# Patient Record
Sex: Female | Born: 1960 | Race: White | Hispanic: No | Marital: Married | Smoking: Never smoker
Health system: Southern US, Community
[De-identification: ages and names within clinical notes are randomized; demographics above are authoritative.]

---

## 2012-02-02 DIAGNOSIS — C4491 Basal cell carcinoma of skin, unspecified: Secondary | ICD-10-CM | POA: Insufficient documentation

## 2013-01-28 DIAGNOSIS — E785 Hyperlipidemia, unspecified: Secondary | ICD-10-CM | POA: Insufficient documentation

## 2016-10-18 ENCOUNTER — Other Ambulatory Visit (HOSPITAL_COMMUNITY)
Admission: RE | Admit: 2016-10-18 | Discharge: 2016-10-18 | Disposition: A | Discharge status: Home / Self Care | Payer: BC Managed Care – PPO | Source: Ambulatory Visit | Attending: Obstetrics and Gynecology | Admitting: Obstetrics and Gynecology

## 2016-10-18 ENCOUNTER — Other Ambulatory Visit: Payer: Self-pay | Admitting: Obstetrics and Gynecology

## 2016-10-18 DIAGNOSIS — Z01419 Encounter for gynecological examination (general) (routine) without abnormal findings: Secondary | ICD-10-CM | POA: Insufficient documentation

## 2016-10-18 DIAGNOSIS — Z1151 Encounter for screening for human papillomavirus (HPV): Secondary | ICD-10-CM | POA: Insufficient documentation

## 2016-10-20 LAB — CYTOLOGY - PAP
Diagnosis: NEGATIVE
HPV: NOT DETECTED

## 2016-11-09 ENCOUNTER — Other Ambulatory Visit: Payer: Self-pay | Admitting: Obstetrics and Gynecology

## 2016-11-09 DIAGNOSIS — Z1231 Encounter for screening mammogram for malignant neoplasm of breast: Secondary | ICD-10-CM

## 2016-12-06 ENCOUNTER — Ambulatory Visit
Admission: RE | Admit: 2016-12-06 | Discharge: 2016-12-06 | Disposition: A | Discharge status: Home / Self Care | Payer: BC Managed Care – PPO | Source: Ambulatory Visit | Attending: Obstetrics and Gynecology | Admitting: Obstetrics and Gynecology

## 2016-12-06 DIAGNOSIS — Z1231 Encounter for screening mammogram for malignant neoplasm of breast: Secondary | ICD-10-CM

## 2018-01-03 ENCOUNTER — Other Ambulatory Visit: Payer: Self-pay | Admitting: Family Medicine

## 2018-01-03 DIAGNOSIS — Z1231 Encounter for screening mammogram for malignant neoplasm of breast: Secondary | ICD-10-CM

## 2018-01-25 ENCOUNTER — Encounter: Payer: Self-pay | Admitting: Radiology

## 2018-01-25 ENCOUNTER — Ambulatory Visit
Admission: RE | Admit: 2018-01-25 | Discharge: 2018-01-25 | Disposition: A | Discharge status: Home / Self Care | Payer: BC Managed Care – PPO | Source: Ambulatory Visit | Attending: Family Medicine | Admitting: Family Medicine

## 2018-01-25 DIAGNOSIS — Z1231 Encounter for screening mammogram for malignant neoplasm of breast: Secondary | ICD-10-CM

## 2019-02-05 ENCOUNTER — Other Ambulatory Visit: Payer: Self-pay | Admitting: Family Medicine

## 2019-02-05 DIAGNOSIS — Z1231 Encounter for screening mammogram for malignant neoplasm of breast: Secondary | ICD-10-CM

## 2019-02-07 ENCOUNTER — Other Ambulatory Visit: Payer: Self-pay

## 2019-02-07 ENCOUNTER — Ambulatory Visit
Admission: RE | Admit: 2019-02-07 | Discharge: 2019-02-07 | Disposition: A | Discharge status: Home / Self Care | Payer: BC Managed Care – PPO | Source: Ambulatory Visit | Attending: Family Medicine | Admitting: Family Medicine

## 2019-02-07 DIAGNOSIS — Z1231 Encounter for screening mammogram for malignant neoplasm of breast: Secondary | ICD-10-CM

## 2019-07-29 ENCOUNTER — Other Ambulatory Visit: Payer: Self-pay | Admitting: Family Medicine

## 2019-07-29 DIAGNOSIS — R1013 Epigastric pain: Secondary | ICD-10-CM

## 2019-08-01 ENCOUNTER — Ambulatory Visit
Admission: RE | Admit: 2019-08-01 | Discharge: 2019-08-01 | Disposition: A | Discharge status: Home / Self Care | Payer: BC Managed Care – PPO | Source: Ambulatory Visit | Attending: Family Medicine | Admitting: Family Medicine

## 2019-08-01 DIAGNOSIS — R1013 Epigastric pain: Secondary | ICD-10-CM

## 2019-12-29 ENCOUNTER — Other Ambulatory Visit: Payer: Self-pay | Admitting: Family Medicine

## 2019-12-29 DIAGNOSIS — Z1231 Encounter for screening mammogram for malignant neoplasm of breast: Secondary | ICD-10-CM

## 2020-02-09 ENCOUNTER — Other Ambulatory Visit: Payer: Self-pay

## 2020-02-09 ENCOUNTER — Ambulatory Visit
Admission: RE | Admit: 2020-02-09 | Discharge: 2020-02-09 | Disposition: A | Discharge status: Home / Self Care | Payer: BC Managed Care – PPO | Source: Ambulatory Visit | Attending: Family Medicine | Admitting: Family Medicine

## 2020-02-09 DIAGNOSIS — Z1231 Encounter for screening mammogram for malignant neoplasm of breast: Secondary | ICD-10-CM

## 2020-02-10 ENCOUNTER — Other Ambulatory Visit: Payer: Self-pay | Admitting: Family Medicine

## 2020-02-10 DIAGNOSIS — R928 Other abnormal and inconclusive findings on diagnostic imaging of breast: Secondary | ICD-10-CM

## 2020-02-16 ENCOUNTER — Ambulatory Visit: Payer: BC Managed Care – PPO

## 2020-02-16 ENCOUNTER — Other Ambulatory Visit: Payer: Self-pay

## 2020-02-16 ENCOUNTER — Ambulatory Visit
Admission: RE | Admit: 2020-02-16 | Discharge: 2020-02-16 | Disposition: A | Discharge status: Home / Self Care | Payer: BC Managed Care – PPO | Source: Ambulatory Visit | Attending: Family Medicine | Admitting: Family Medicine

## 2020-02-16 DIAGNOSIS — R928 Other abnormal and inconclusive findings on diagnostic imaging of breast: Secondary | ICD-10-CM

## 2020-09-09 DIAGNOSIS — D225 Melanocytic nevi of trunk: Secondary | ICD-10-CM | POA: Diagnosis not present

## 2020-09-09 DIAGNOSIS — L821 Other seborrheic keratosis: Secondary | ICD-10-CM | POA: Diagnosis not present

## 2020-09-09 DIAGNOSIS — Z85828 Personal history of other malignant neoplasm of skin: Secondary | ICD-10-CM | POA: Diagnosis not present

## 2020-12-02 DIAGNOSIS — Z23 Encounter for immunization: Secondary | ICD-10-CM | POA: Diagnosis not present

## 2020-12-27 DIAGNOSIS — Z5181 Encounter for therapeutic drug level monitoring: Secondary | ICD-10-CM | POA: Diagnosis not present

## 2020-12-27 DIAGNOSIS — M810 Age-related osteoporosis without current pathological fracture: Secondary | ICD-10-CM | POA: Diagnosis not present

## 2020-12-27 LAB — LAB REPORT - SCANNED: EGFR: 102

## 2020-12-30 DIAGNOSIS — Z0000 Encounter for general adult medical examination without abnormal findings: Secondary | ICD-10-CM | POA: Diagnosis not present

## 2021-01-13 ENCOUNTER — Other Ambulatory Visit: Payer: Self-pay | Admitting: Family Medicine

## 2021-01-13 DIAGNOSIS — Z1231 Encounter for screening mammogram for malignant neoplasm of breast: Secondary | ICD-10-CM

## 2021-03-04 ENCOUNTER — Other Ambulatory Visit: Payer: Self-pay

## 2021-03-04 ENCOUNTER — Ambulatory Visit
Admission: RE | Admit: 2021-03-04 | Discharge: 2021-03-04 | Disposition: A | Discharge status: Home / Self Care | Payer: BC Managed Care – PPO | Source: Ambulatory Visit | Attending: Family Medicine | Admitting: Family Medicine

## 2021-03-04 DIAGNOSIS — Z1231 Encounter for screening mammogram for malignant neoplasm of breast: Secondary | ICD-10-CM

## 2021-03-24 DIAGNOSIS — D225 Melanocytic nevi of trunk: Secondary | ICD-10-CM | POA: Diagnosis not present

## 2021-03-24 DIAGNOSIS — L814 Other melanin hyperpigmentation: Secondary | ICD-10-CM | POA: Diagnosis not present

## 2021-03-24 DIAGNOSIS — L570 Actinic keratosis: Secondary | ICD-10-CM | POA: Diagnosis not present

## 2021-03-24 DIAGNOSIS — L821 Other seborrheic keratosis: Secondary | ICD-10-CM | POA: Diagnosis not present

## 2021-03-24 DIAGNOSIS — L578 Other skin changes due to chronic exposure to nonionizing radiation: Secondary | ICD-10-CM | POA: Diagnosis not present

## 2022-01-15 ENCOUNTER — Emergency Department (HOSPITAL_COMMUNITY): Payer: BC Managed Care – PPO

## 2022-01-15 ENCOUNTER — Emergency Department (HOSPITAL_COMMUNITY)
Admission: EM | Admit: 2022-01-15 | Discharge: 2022-01-15 | Disposition: A | Discharge status: Home / Self Care | Payer: BC Managed Care – PPO | Attending: Emergency Medicine | Admitting: Emergency Medicine

## 2022-01-15 ENCOUNTER — Other Ambulatory Visit: Payer: Self-pay

## 2022-01-15 ENCOUNTER — Encounter (HOSPITAL_COMMUNITY): Payer: Self-pay

## 2022-01-15 DIAGNOSIS — M5416 Radiculopathy, lumbar region: Secondary | ICD-10-CM | POA: Insufficient documentation

## 2022-01-15 DIAGNOSIS — M545 Low back pain, unspecified: Secondary | ICD-10-CM | POA: Insufficient documentation

## 2022-01-15 DIAGNOSIS — W11XXXA Fall on and from ladder, initial encounter: Secondary | ICD-10-CM | POA: Insufficient documentation

## 2022-01-15 DIAGNOSIS — M25551 Pain in right hip: Secondary | ICD-10-CM | POA: Insufficient documentation

## 2022-01-15 DIAGNOSIS — S32010A Wedge compression fracture of first lumbar vertebra, initial encounter for closed fracture: Secondary | ICD-10-CM | POA: Diagnosis not present

## 2022-01-15 DIAGNOSIS — S32010S Wedge compression fracture of first lumbar vertebra, sequela: Secondary | ICD-10-CM | POA: Diagnosis not present

## 2022-01-15 DIAGNOSIS — W11XXXS Fall on and from ladder, sequela: Secondary | ICD-10-CM | POA: Insufficient documentation

## 2022-01-15 DIAGNOSIS — S32009S Unspecified fracture of unspecified lumbar vertebra, sequela: Secondary | ICD-10-CM | POA: Diagnosis present

## 2022-01-15 MED ORDER — ACETAMINOPHEN 325 MG PO TABS
650.0000 mg | ORAL_TABLET | Freq: Four times a day (QID) | ORAL | Status: DC | PRN
  Administered 2022-01-15: 650 mg via ORAL
  Filled 2022-01-15: qty 2

## 2022-01-15 MED ORDER — IBUPROFEN 800 MG PO TABS
800.0000 mg | ORAL_TABLET | Freq: Once | ORAL | Status: AC
  Administered 2022-01-15: 800 mg via ORAL
  Filled 2022-01-15: qty 1

## 2022-01-15 MED ORDER — LIDOCAINE 5 % EX PTCH
1.0000 | MEDICATED_PATCH | CUTANEOUS | Status: DC
  Administered 2022-01-15: 1 via TRANSDERMAL
  Filled 2022-01-15: qty 1

## 2022-01-15 MED ORDER — ACETAMINOPHEN 500 MG PO TABS
1000.0000 mg | ORAL_TABLET | Freq: Once | ORAL | Status: AC
  Administered 2022-01-15: 1000 mg via ORAL
  Filled 2022-01-15: qty 2

## 2022-01-15 NOTE — ED Notes (Signed)
Advised patient can not eat or drink anything at this time until we get scans and determine nothing surgical is going on. Husband verbalized understanding

## 2022-01-15 NOTE — ED Triage Notes (Signed)
Patient reports that she fell from a 6 ft ladder and landed on her tailbone. Patient c/o tingling from hips to feet when she is sitting. Patient denies hitting her head or having LOC.

## 2022-01-15 NOTE — ED Notes (Signed)
Called [facility] ortho tech who reports that brace guy had to go to [facility] first and then he would come here so probably another hour.

## 2022-01-15 NOTE — ED Provider Notes (Signed)
[facility] DEPT Provider Note   CSN: [id number] Arrival date & time: 01/15/22  1335     History  Chief Complaint  Patient presents with   Lytle Michaels    Leslie Randolph is a 61 y.o. female.  61 year old female previously healthy presents the emergency department with back pain radiating down her left leg and right hip pain after fall from a ladder.  Patient states that she was working outside on a ladder.  She is on the second top rung and her feet were approximately 5 feet off the ground.  Says that she fell landing directly on her bottom.  Says that she did not have a head strike or LOC.  Denies any other pain at this time including her arms.  Says that she was able to walk afterwards but is having a pain that shoots down her left leg.  Also states that she is having lower back pain and right hip pain.  Pt was able to ambulate into the ED afterwards.    Fall   History reviewed. No pertinent past medical history.     Home Medications Prior to Admission medications   Medication Sig Start Date End Date Taking? Authorizing Provider  Ascorbic Acid (VITAMIN C WITH ROSE HIPS) 1000 MG tablet Take 1,000 mg by mouth daily with breakfast.   Yes [provider]  Biotin 5000 MCG TABS Take 5,000 mcg by mouth daily with breakfast.   Yes [provider]  Cholecalciferol (D3) 50 MCG (2000 UT) TABS Take 2,000 Units by mouth daily with breakfast.   Yes [provider]  Coenzyme Q10 (COQ10) 100 MG CAPS Take 100 mg by mouth daily with breakfast.   Yes [provider]  Cranberry 500 MG TABS Take 500 mg by mouth daily with breakfast.   Yes [provider]  Folic Acid (FOLATE PO) Take 1,333 mcg by mouth daily with breakfast.   Yes [provider]  Lactobacillus (ACIDOPHILUS) CAPS capsule Take 1 capsule by mouth at bedtime.   Yes [provider]  levOCARNitine (L-CARNITINE PO) Take 1 capsule by mouth daily with  breakfast.   Yes [provider]  MAGNESIUM CITRATE PO Take 210 mg by mouth daily with breakfast.   Yes [provider]  Methylcobalamin 1000 MCG TBDP Take 1,000 mcg by mouth daily with breakfast. B12   Yes [provider]  Multiple Vitamin (MULTIVITAMIN WITH MINERALS) TABS tablet Take 1 tablet by mouth daily with breakfast.   Yes [provider]  Omega-3 Fatty Acids (FISH OIL TRIPLE STRENGTH) 1400 MG CAPS Take 2,800 mg by mouth daily with breakfast.   Yes [provider]  OVER THE COUNTER MEDICATION Take 1 tablet by mouth daily with breakfast. Chelated zinc 15 mg with copper 2 mg   Yes [provider]  OVER THE COUNTER MEDICATION Take 400 mg by mouth daily with breakfast. Gingko biloba   Yes [provider]  Quercetin 500 MG CAPS Take 500 mg by mouth daily with breakfast.   Yes [provider]  Turmeric 500 MG CAPS Take 500 mg by mouth daily with breakfast.   Yes [provider]  vitamin E 200 UNIT capsule Take 200 Units by mouth daily with breakfast.   Yes [provider]      Allergies    Amiodarone, Bisphosphonates, Ciprofloxacin, Clindamycin/lincomycin, Fentanyl, Hydrocodone, Hydrocodone-acetaminophen, Morphine, Other, Oxycontin [oxycodone], Quinolones, and Statins    Review of Systems   Review of Systems  Physical Exam  Updated Vital Signs BP 121/88   Pulse 77   Temp 98.1 F (36.7 C) (Oral)   Resp 16   Ht '5\' 4"'$  (1.626 m)   Wt 56.7 kg   SpO2 97%   BMI 21.46 kg/m  Physical Exam Vitals and nursing note reviewed.  Constitutional:      General: She is not in acute distress.    Appearance: She is well-developed.  HENT:     Head: Normocephalic and atraumatic.     Right Ear: External ear normal.     Left Ear: External ear normal.     Nose: Nose normal.  Eyes:     Extraocular Movements: Extraocular movements intact.     Conjunctiva/sclera: Conjunctivae normal.     Pupils: Pupils are equal,  round, and reactive to light.  Neck:     Comments: C-collar in place Cardiovascular:     Rate and Rhythm: Normal rate and regular rhythm.  Pulmonary:     Effort: Pulmonary effort is normal.  Abdominal:     General: Abdomen is flat. Bowel sounds are normal. There is no distension.     Palpations: Abdomen is soft. There is no mass.     Tenderness: There is no abdominal tenderness. There is no guarding.  Musculoskeletal:        General: No swelling.  Skin:    General: Skin is warm and dry.     Capillary Refill: Capillary refill takes less than 2 seconds.  Neurological:     Mental Status: She is alert.     Comments: Spinal midline tenderness to palpation at approximately L3 to sacrum. No stepoffs noted.   Motor: Muscle bulk and tone are normal. Strength is 5/5 in hip flexion, knee flexion and extension, ankle dorsiflexion and plantar flexion bilaterally. Full strength of great toe dorsiflexion bilaterally.  Sensory: Intact sensation to light touch in L5 though S1 dermatomes bilaterally.   Intact pubic symphysis.  Psychiatric:        Mood and Affect: Mood normal.     ED Results / Procedures / Treatments   Labs (all labs ordered are listed, but only abnormal results are displayed) Labs Reviewed - No data to display  EKG None  Radiology CT Lumbar Spine Wo Contrast  Result Date: 01/15/2022 CLINICAL DATA:  Fall ladder. Right hip pain. EXAM: CT LUMBAR SPINE WITHOUT CONTRAST TECHNIQUE: Multidetector CT imaging of the lumbar spine was performed without intravenous contrast administration. Multiplanar CT image reconstructions were also generated. RADIATION DOSE REDUCTION: This exam was performed according to the departmental dose-optimization program which includes automated exposure control, adjustment of the mA and/or kV according to patient size and/or use of iterative reconstruction technique. COMPARISON:  None Available. FINDINGS: Segmentation: 5 non rib-bearing lumbar type vertebral  bodies are present. The lowest fully formed vertebral body is L5. Alignment: Slight degenerative retrolisthesis present at L2-3. No other significant listhesis is present. Lumbar lordosis is preserved. Vertebrae: A superior endplate fracture is present at L1 with over 50% loss of height relative to the T12 vertebral body. Slight retropulsed bone is noted on the right without significant central canal narrowing. Foramina are patent bilaterally. No additional fractures are evident. Paraspinal and other soft tissues: Stranding about the fracture likely represents minimal paraspinal hematoma. No intracanalicular hemorrhage present. Visualized abdomen is unremarkable. Disc levels: L1-2: No significant stenosis. L2-3: Mild facet hypertrophy is present. Mild disc bulging is present without significant stenosis. L3-4: Mild disc bulging and facet hypertrophy is present without significant stenosis. L4-5: Mild disc  bulging and facet hypertrophy is present without significant stenosis. L5-S1: Incomplete posterior spinous fusion is present. No significant protrusion present. Asymmetric left-sided facet hypertrophy present without significant stenosis. IMPRESSION: 1. Superior endplate fracture at L1 with over 50% loss of height relative to the T12 vertebral body. 2. Slight retropulsed bone on the right without significant central canal stenosis. 3. No additional fractures. 4. Mild multilevel spondylosis as described. These results were called by telephone at the time of interpretation on 01/15/2022 at 4:25 pm to provider [facility] , who verbally acknowledged these results. Electronically Signed   By: San Morelle M.D.   On: 01/15/2022 16:27   CT PELVIS WO CONTRAST  Result Date: 01/15/2022 CLINICAL DATA:  Fall 5 a ladder. Right hip pain. EXAM: CT PELVIS WITHOUT CONTRAST TECHNIQUE: Multidetector CT imaging of the pelvis was performed following the standard protocol without intravenous contrast. RADIATION DOSE  REDUCTION: This exam was performed according to the departmental dose-optimization program which includes automated exposure control, adjustment of the mA and/or kV according to patient size and/or use of iterative reconstruction technique. COMPARISON:  None Available. FINDINGS: Urinary Tract: Distal ureters and urinary bladder are within normal limits. Bowel:  Visualized large and small bowel is within normal limits. Vascular/Lymphatic: Atherosclerotic calcifications are present the distal aorta. No aneurysm. No significant adenopathy is present. Reproductive:  No mass or other significant abnormality Other:  Free fluid or free air is present. Musculoskeletal: The hips are located bilaterally. No acute fractures are present. Bony pelvis is within normal limits. IMPRESSION: No acute or healing fractures. Electronically Signed   By: San Morelle M.D.   On: 01/15/2022 16:18    Procedures Procedures   Medications Ordered in ED Medications  lidocaine (LIDODERM) 5 % 1 patch (1 patch Transdermal Patch Applied 01/15/22 1452)  acetaminophen (TYLENOL) tablet 650 mg (650 mg Oral Given 01/15/22 1942)  acetaminophen (TYLENOL) tablet 1,000 mg (1,000 mg Oral Given 01/15/22 1452)  ibuprofen (ADVIL) tablet 800 mg (800 mg Oral Given 01/15/22 1452)    ED Course/ Medical Decision Making/ A&P Clinical Course as of 01/15/22 2349  Sun Jan 15, 2022  1614 CT reviewed and interpreted by me and does show L1 vertebral body fracture.  Neurosurgery consulted. [RP]  3295 Called by radiology about lumbar spine fracture.  States that the pedicles are not involved and feels that the compression fracture is stable.  [RP]  1640 CT Lumbar Spine Wo Contrast IMPRESSION: 1. Superior endplate fracture at L1 with over 50% loss of height relative to the T12 vertebral body. 2. Slight retropulsed bone on the right without significant central canal stenosis. 3. No additional fractures. 4. Mild multilevel spondylosis as  described.   [RP]  1884 Spoke with Dr Marvis Moeller, DNP regarding patient.  He has reviewed imaging.  Recommends TSLO brace and follow-up in clinic in 4 weeks. [RP]    Clinical Course User Index [RP] Fransico Meadow, MD                           Medical Decision Making 61 year old female otherwise healthy who presented to the emergency department with back pain after falling off of a ladder.  Patient states that her pain is localized to the lumbar spine at this time.  Did not have head strike or LOC.  Intact neurologic exam though does states she has paresthesias down her left leg.  No cervical spine pain and her c-collar was cleared clinically.  CT of  the lumbar spine and pelvis showed compression fracture of L1.  Discussed with on-call neurosurgery PA felt that the patient could have a back brace and follow-up in clinic in 1 month.  Patient was given a brace and was discharged home with instructions for pain management.    Amount and/or Complexity of Data Reviewed Radiology: ordered. Decision-making details documented in ED Course.  Risk OTC drugs. Prescription drug management.    Final Clinical Impression(s) / ED Diagnoses Final diagnoses:  Compression fracture of L1 vertebra, sequela  Lumbar radiculopathy  Pain of right hip    Rx / DC Orders ED Discharge Orders     None         Fransico Meadow, MD 01/15/22 2349

## 2022-01-15 NOTE — ED Notes (Signed)
Patient ok to eat per Md

## 2022-01-15 NOTE — ED Provider Triage Note (Signed)
Emergency Medicine Provider Triage Evaluation Note  Leslie Randolph , a 61 y.o. female  was evaluated in triage.  Pt complains of diffuse back pain after a fall off a 6 foot latter prior to arrival. She denies hitting her head. She reports that she landed on her bottom. Deniies any blood thinner. Denies any chest pain or abdominal pain.  Review of Systems  Positive:  Negative:   Physical Exam  BP 137/69 (BP Location: Left Arm)   Pulse 78   Temp 98.1 F (36.7 C) (Oral)   Resp 18   Ht '5\' 4"'$  (1.626 m)   Wt 56.7 kg   SpO2 100%   BMI 21.46 kg/m  Gen:   Awake, no distress   Resp:  Normal effort  MSK:   Moves extremities without difficulty  Other:  No chest or abdominal pain.   Medical Decision Making  Medically screening exam initiated at 2:16 PM.  Appropriate orders placed.  Leslie Randolph was informed that the remainder of the evaluation will be completed by another provider, this initial triage assessment does not replace that evaluation, and the importance of remaining in the ED until their evaluation is complete.  C-collar and spinal precautions patietnt being roomed now.     Sherrell Puller, PA-C 01/15/22 8016

## 2022-01-15 NOTE — ED Notes (Signed)
Patient requesting pain meds for hips, MD secured chatted.  Waiting on orders.

## 2022-01-15 NOTE — Discharge Instructions (Addendum)
Today you were seen in the emergency department after the fall from the ladder    In the emergency department you had CT scans that showed a lumbar spine fracture.    At home, please take Tylenol and Motrin as needed for your pain.  You may also use over-the-counter lidocaine patches.  Use the back brace we have prescribed you until you follow-up with neurosurgery.  You may take it off to shower but wear it otherwise.   Follow-up with your primary doctor in 2-3 days regarding your visit.  Follow-up with neurosurgery in 1 month regarding your visit and discussed with them when you may stop using the brace.  Return immediately to the emergency department if you experience any of the following: Leg numbness or weakness, urinary or bowel incontinence, or difficulty urinating, or any other concerning symptoms.    Thank you for visiting our Emergency Department. It was a pleasure taking care of you today.

## 2022-01-15 NOTE — Progress Notes (Signed)
Orthopedic Tech Progress Note Patient Details:  Leslie Randolph Aug 27, 1960 [id number]  Patient ID: Leslie Randolph, female   DOB: Oct 19, 1960, 61 y.o.   MRN: [id number]  [place] for brace order. Cristobal Goldmann 01/15/2022, 4:23 PM

## 2022-01-15 NOTE — ED Notes (Signed)
Called [facility] ortho tech to arrange delivery of TLSO brace.

## 2022-01-18 ENCOUNTER — Ambulatory Visit (INDEPENDENT_AMBULATORY_CARE_PROVIDER_SITE_OTHER): Payer: BC Managed Care – PPO | Admitting: Internal Medicine

## 2022-01-18 ENCOUNTER — Encounter: Payer: Self-pay | Admitting: Internal Medicine

## 2022-01-18 VITALS — BP 100/68 | HR 93 | Temp 98.0°F | Ht 64.0 in | Wt 125.6 lb

## 2022-01-18 DIAGNOSIS — L814 Other melanin hyperpigmentation: Secondary | ICD-10-CM | POA: Insufficient documentation

## 2022-01-18 DIAGNOSIS — D225 Melanocytic nevi of trunk: Secondary | ICD-10-CM | POA: Insufficient documentation

## 2022-01-18 DIAGNOSIS — Z85828 Personal history of other malignant neoplasm of skin: Secondary | ICD-10-CM | POA: Insufficient documentation

## 2022-01-18 DIAGNOSIS — S32010A Wedge compression fracture of first lumbar vertebra, initial encounter for closed fracture: Secondary | ICD-10-CM | POA: Insufficient documentation

## 2022-01-18 DIAGNOSIS — S32000A Wedge compression fracture of unspecified lumbar vertebra, initial encounter for closed fracture: Secondary | ICD-10-CM

## 2022-01-18 DIAGNOSIS — L821 Other seborrheic keratosis: Secondary | ICD-10-CM | POA: Insufficient documentation

## 2022-01-18 DIAGNOSIS — S32010D Wedge compression fracture of first lumbar vertebra, subsequent encounter for fracture with routine healing: Secondary | ICD-10-CM

## 2022-01-18 HISTORY — DX: Wedge compression fracture of unspecified lumbar vertebra, initial encounter for closed fracture: S32.000A

## 2022-01-18 MED ORDER — CODEINE SULFATE 30 MG PO TABS: 30.0000 mg | ORAL_TABLET | Freq: Four times a day (QID) | ORAL | 0 refills | Status: AC | PRN

## 2022-01-18 NOTE — Assessment & Plan Note (Addendum)
This is an acute, new, and complex problem which required extensive patient education on safety and management while she awaits neurosurgery eval.  She is wearing tur? brace which we discussed how to wear properly  We discussed avoiding axial loading and spine safety She will keep planned neurosurgery referralI spoke with our physical therapists who said they could take in consult, and referred. I independently  Reviewed and interpreted  the CT images for the spine and pelvis from the ER, showing the images to the patient showing the complicated L1 fracture and the hips look fine so I attribute her hip pain to nerve compression from the fracture.  I agreed to write one week of codeine for pain, and follow up next week to see how its doing I strongly encouraged calcium and vit D supplement. She agreed to do.  She disagreed/defers on my recommendation to use bisphosphonates, which I made based on areas of poor bone mineralization that I saw on her CT scan.

## 2022-01-18 NOTE — Progress Notes (Signed)
      Leslie Randolph is a 61 y.o. female who presents today for an office visit. She would like to establish care but the visit was focused primarily on following up her ER visit and managing her acute l1 spinal fracture  Assessment/Plan:  Leslie Randolph was seen today for establish care and follow-up.  Compression fracture of L1 vertebra with routine healing, subsequent encounter Assessment & Plan: This is an acute, new, and complex problem which required extensive patient education on safety and management while she awaits neurosurgery eval.  She is wearing tur? brace which we discussed how to wear properly  We discussed avoiding axial loading and spine safety She will keep planned neurosurgery referralI spoke with our physical therapists who said they could take in consult, and referred. I independently  Reviewed and interpreted  the CT images for the spine and pelvis from the ER, showing the images to the patient showing the complicated L1 fracture and the hips look fine so I attribute her hip pain to nerve compression from the fracture.  I agreed to write one week of codeine for pain, and follow up next week to see how its doing I strongly encouraged calcium and vit D supplement. She agreed to do.  She disagreed/defers on my recommendation to use bisphosphonates, which I made based on areas of poor bone mineralization that I saw on her CT scan.  Orders: -     Codeine Sulfate; Take 1 tablet (30 mg total) by mouth every 6 (six) hours as needed for up to 8 days.  Dispense: 30 tablet; Refill: 0 -     Ambulatory referral to Physical Therapy     Subjective:  HPI:  This is a new patient visit for PCP establish was following with [facility] but her pcp there left.  Also a hospital follow up for L1 compression fracture. Also she wants to talk about physical therapy-referred  [address] off ladder landed in grass on buttocks and went to ER 3 days ago.  Has been using ibuprofen and brace for  pain but not adequate. Er offered oxycontin which she didn't want but declined to give her codeine which she knows she cant take.          Objective:  Physical Exam: BP 100/68   Pulse 93   Temp 98 F (36.7 C) (Temporal)   Ht '5\' 4"'$  (1.626 m)   Wt 125 lb 9.6 oz (57 kg)   SpO2 94%   BMI 21.56 kg/m    Gen: No acute distress, uncomfortable, never sits down, remains standing. Psych: Normal affect and thought content  Problem specific physical exam findings: wearing brace, able to stand and walk normally with brace.        Loralee Pacas, MD 01/18/2022 3:27 PM

## 2022-01-19 ENCOUNTER — Ambulatory Visit (INDEPENDENT_AMBULATORY_CARE_PROVIDER_SITE_OTHER): Payer: BC Managed Care – PPO | Admitting: Physical Therapy

## 2022-01-19 ENCOUNTER — Encounter: Payer: Self-pay | Admitting: Physical Therapy

## 2022-01-19 DIAGNOSIS — M25551 Pain in right hip: Secondary | ICD-10-CM | POA: Diagnosis not present

## 2022-01-19 DIAGNOSIS — M6281 Muscle weakness (generalized): Secondary | ICD-10-CM

## 2022-01-19 DIAGNOSIS — M25552 Pain in left hip: Secondary | ICD-10-CM

## 2022-01-19 DIAGNOSIS — M5459 Other low back pain: Secondary | ICD-10-CM

## 2022-01-19 NOTE — Therapy (Signed)
OUTPATIENT PHYSICAL THERAPY THORACOLUMBAR EVALUATION   Patient Name: GENEVA BARRERO MRN: [id number] DOB:07/02/1960, 61 y.o., female Today's Date: 01/19/2022   PT End of Session - 01/19/22 0806     Visit Number 1    Number of Visits 20    Date for PT Re-Evaluation 04/13/22    Authorization Type BCBS    PT Start Time 0807    PT Stop Time 0846    PT Time Calculation (min) 39 min    Activity Tolerance Patient tolerated treatment well    Behavior During Therapy [facility] for tasks assessed/performed             Past Medical History:  Diagnosis Date   Fracture of lumbar vertebra, compression ([place]) 01/18/2022   History reviewed. No pertinent surgical history. Patient Active Problem List   Diagnosis Date Noted   Seborrheic keratoses 01/18/2022   Melanocytic nevi of trunk 01/18/2022   Lentigines 01/18/2022   History of skin cancer 01/18/2022   Closed compression fracture of body of L1 vertebra ([place]) 01/18/2022   HLD (hyperlipidemia) 01/28/2013   Basal cell carcinoma of skin 02/02/2012    PCP: Berniece Pap, MD  REFERRING PROVIDER: Berniece Pap, MD  REFERRING DIAG: S32.010D (ICD-10-CM) - Compression fracture of L1 vertebra with routine healing, subsequent encounter  Rationale for Evaluation and Treatment Rehabilitation  THERAPY DIAG:  Other low back pain  Pain in left hip  Pain in right hip  Muscle weakness (generalized)  ONSET DATE: 01/15/22  SUBJECTIVE:                                                                                                                                                                                           SUBJECTIVE STATEMENT: Patient fell from 6 ft ladder and landed on tailbone on 02/15/22. Had tingling in hips and feet with sitting at first and now mainly pain in hips with R>L. States she was placed in a TLSO brace and sees neurosurgery on 01/30/22.  Pain is worse in the morning, has been wearing brace in bed and all day. States that  she is constantly adjusting the brace as it shifts.  PERTINENT HISTORY:  none  PAIN:  Are you having pain? Yes: NPRS scale: 4/10 Pain location: mid low back and into hip Pain description: aching Aggravating factors: sitting Relieving factors: laying down, movement   PRECAUTIONS: Other: spinal  WEIGHT BEARING RESTRICTIONS No  FALLS:  Has patient fallen in last 6 months? Yes. Number of falls 1 off ladder  LIVING ENVIRONMENT: Lives with: lives with their family Lives in: House/apartment Stairs: Yes: Internal: 12 steps; on right going up and External:  9 steps; on left going up Has following equipment at home: donner, and grabber   OCCUPATION: achieve librarian   PLOF: Independent, likes to hike, body pump classes, very active  PATIENT GOALS to get back to prior level function   OBJECTIVE:   Transitional movements: stiff log rolling supine to sit, stiff with B UE to transition from sit to stand DIAGNOSTIC FINDINGS:  01/15/22 Hip CT IMPRESSION: No acute or healing fractures.  Lumbar CT IMPRESSION: 1. Superior endplate fracture at L1 with over 50% loss of height relative to the T12 vertebral body. 2. Slight retropulsed bone on the right without significant central canal stenosis. 3. No additional fractures. 4. Mild multilevel spondylosis as described.   SCREENING FOR RED FLAGS: Bowel or bladder incontinence: No Spinal tumors: No Cauda equina syndrome: No Compression fracture: Yes: observed in flims Abdominal aneurysm: No  COGNITION:  Overall cognitive status: Within functional limits for tasks assessed     SENSATION: WFL    POSTURE: in brace   PALPATION: Tenderness to palpation in lumbar/glute muscles  LUMBAR ROM: not tested secondary to recent fracture- awaiting neuro consult  Active  A/PROM    Flexion   Extension   Right lateral flexion   Left lateral flexion   Right rotation   Left rotation    (Blank rows = not tested)     LE  Measurements not tested on this date Lower Extremity Right 01/19/2022 Left 01/19/2022   A/PROM MMT A/PROM MMT  Hip Flexion      Hip Extension      Hip Abduction      Hip Adduction      Hip Internal rotation      Hip External rotation      Knee Flexion      Knee Extension      Ankle Dorsiflexion      Ankle Plantarflexion      Ankle Inversion      Ankle Eversion       (Blank rows = not tested)  * pain    TODAY'S TREATMENT  01/19/2022  Therapeutic Exercise:  Aerobic: Supine: bent knee fall outs 2 minutes, bent knee fall ins (no lumbar ROT) 2 minutes, hamstring stretch 2 minutes Each leg. Towel to support, diaphragmatic breathing - difficult 2 minutes Prone:  Seated:  Standing: Neuromuscular Re-education: Manual Therapy: Therapeutic Activity: Self Care: Trigger Point Dry Needling:  Modalities:    PATIENT EDUCATION:  Education details: on current presentation, on HEP, on clinical outcomes score and POC, on spinal restrictions, on no axial loading, on walking/ safe movements Person educated: Patient Education method: Explanation, Demonstration, and Handouts Education comprehension: verbalized understanding    HOME EXERCISE PROGRAM: [id number]  ASSESSMENT:  CLINICAL IMPRESSION: Patient presents with acute lumbar fracture after a fall on a ladder on 01/15/22. She was seen in the ED where they gave her a TSLO brace and scheduled her an apt to follow up with neurosurgeon. Session focused on education secondary to wanting patient to follow up with MD prior to adding in more advanced exercises. Patient would greatly benefit form skilled PT to improve overall function and return them to quality of life.    OBJECTIVE IMPAIRMENTS decreased activity tolerance, decreased endurance, decreased mobility, decreased ROM, improper body mechanics, postural dysfunction, and pain.   ACTIVITY LIMITATIONS carrying, lifting, bending, sitting, standing, squatting, sleeping, transfers, and bed  mobility  PARTICIPATION LIMITATIONS: meal prep, cleaning, driving, community activity, and yard work  [place] are also affecting patient's functional outcome.  REHAB POTENTIAL: Excellent  CLINICAL DECISION MAKING: Stable/uncomplicated  EVALUATION COMPLEXITY: Low    GOALS: Goals reviewed with patient?  yes  SHORT TERM GOALS:  Patient will be independent in self management strategies to improve quality of life and functional outcomes. Baseline: new program Target date: 03/02/2022 Goal status: INITIAL  2.  Patient will report at least 50% improvement in overall symptoms and/or function to demonstrate improved functional mobility Baseline: 0% Target date: 03/02/2022 Goal status: INITIAL  3.  Patient will be able to get in and out of the bed without pain with transition to standing to improve transitional mobility Baseline: painful Target date: 03/02/2022 Goal status: INITIAL  4.  Patient will be able to ascend and descend stairs with reciprocal gait pattern and no railing to improve ability to go up and down stairs at home  Baseline: one at a time with railing Target date: 03/02/2022 Goal status: INITIAL     LONG TERM GOALS:  Patient will report at least 75% improvement in overall symptoms and/or function to demonstrate improved functional mobility Baseline: 0% Target date: 04/13/2022 Goal status: INITIAL  2.  Patient will be able to demonstrate good lifting mechanics out of brace, pending MD approval to return to PLOF Baseline: unable Target date: 04/13/2022 Goal status: INITIAL  3.  Patient will be able to hike up to 5 miles without pain or difficulties, per MD approval to return to PLOF. Baseline: unable Target date: 04/13/2022 Goal status: INITIAL  4.  Patient will be able to sit for at least 60 minutes at a time without pain to improve sitting tolerance Baseline: unable Target date: 04/13/2022 Goal status: INITIAL      PLAN: PT  FREQUENCY: 1-2x/week for total of 20 visits over 12 week certification  PT DURATION: 12 weeks  PLANNED INTERVENTIONS: Therapeutic exercises, Therapeutic activity, Neuromuscular re-education, Balance training, Gait training, Patient/Family education, Self Care, Joint mobilization, Orthotic/Fit training, Aquatic Therapy, Dry Needling, Spinal manipulation, Spinal mobilization, Cryotherapy, Moist heat, Ionotophoresis '4mg'$ /ml Dexamethasone, and Manual therapy.  PLAN FOR NEXT SESSION: table exercises, f/u with breath work, isometrics   11:04 AM, 01/19/22 Jerene Pitch, DPT Physical Therapy with [place]

## 2022-01-25 ENCOUNTER — Ambulatory Visit (INDEPENDENT_AMBULATORY_CARE_PROVIDER_SITE_OTHER): Payer: BC Managed Care – PPO | Admitting: Physical Therapy

## 2022-01-25 ENCOUNTER — Ambulatory Visit (INDEPENDENT_AMBULATORY_CARE_PROVIDER_SITE_OTHER): Payer: BC Managed Care – PPO | Admitting: Internal Medicine

## 2022-01-25 ENCOUNTER — Encounter: Payer: Self-pay | Admitting: Internal Medicine

## 2022-01-25 ENCOUNTER — Encounter: Payer: Self-pay | Admitting: Physical Therapy

## 2022-01-25 VITALS — BP 102/70 | HR 76 | Temp 97.9°F | Ht 64.0 in | Wt 126.2 lb

## 2022-01-25 DIAGNOSIS — M5459 Other low back pain: Secondary | ICD-10-CM | POA: Diagnosis not present

## 2022-01-25 DIAGNOSIS — M25552 Pain in left hip: Secondary | ICD-10-CM

## 2022-01-25 DIAGNOSIS — M25551 Pain in right hip: Secondary | ICD-10-CM

## 2022-01-25 DIAGNOSIS — M6281 Muscle weakness (generalized): Secondary | ICD-10-CM

## 2022-01-25 DIAGNOSIS — S32010A Wedge compression fracture of first lumbar vertebra, initial encounter for closed fracture: Secondary | ICD-10-CM

## 2022-01-25 NOTE — Therapy (Signed)
OUTPATIENT PHYSICAL THERAPY TREATMENT NOTE   Patient Name: Leslie Randolph MRN: [id number] DOB:Jul 04, 1960, 61 y.o., female Today's Date: 01/25/2022    END OF SESSION:   PT End of Session - 01/25/22 1102     Visit Number 2    Number of Visits 20    Date for PT Re-Evaluation 04/13/22    Authorization Type BCBS    PT Start Time 1103    PT Stop Time 1141    PT Time Calculation (min) 38 min    Activity Tolerance Patient tolerated treatment well    Behavior During Therapy WFL for tasks assessed/performed             Past Medical History:  Diagnosis Date   Fracture of lumbar vertebra, compression ([place]) 01/18/2022   History reviewed. No pertinent surgical history. Patient Active Problem List   Diagnosis Date Noted   Seborrheic keratoses 01/18/2022   Melanocytic nevi of trunk 01/18/2022   Lentigines 01/18/2022   History of skin cancer 01/18/2022   Closed compression fracture of body of L1 vertebra ([place]) 01/18/2022   HLD (hyperlipidemia) 01/28/2013   Basal cell carcinoma of skin 02/02/2012   PCP: Berniece Pap, MD   REFERRING PROVIDER: Berniece Pap, MD   REFERRING DIAG: S32.010D (ICD-10-CM) - Compression fracture of L1 vertebra with routine healing, subsequent encounter   Rationale for Evaluation and Treatment Rehabilitation   THERAPY DIAG:  Other low back pain   Pain in left hip   Pain in right hip   Muscle weakness (generalized)   ONSET DATE: 01/15/22   SUBJECTIVE:                                                                                                                                                                                            SUBJECTIVE STATEMENT: Saw MD and released to go back to work. Overall feeling better. States she got 6 hours of sleep and is not wearing  brace at night.  Eval: Patient fell from 6 ft ladder and landed on tailbone on 02/15/22. Had tingling in hips and feet with sitting at first and now mainly pain in hips with R>L.  States she was placed in a TLSO brace and sees neurosurgery on 01/30/22.   Pain is worse in the morning, has been wearing brace in bed and all day. States that she is constantly adjusting the brace as it shifts.   PERTINENT HISTORY:  none   PAIN:  Are you having pain? Yes: NPRS scale: 3/10 Pain location: mid low back and into hip Pain description: aching Aggravating factors: sitting Relieving factors: laying down, movement  PRECAUTIONS: Other: spinal   WEIGHT BEARING RESTRICTIONS No   FALLS:  Has patient fallen in last 6 months? Yes. Number of falls 1 off ladder   LIVING ENVIRONMENT: Lives with: lives with their family Lives in: House/apartment Stairs: Yes: Internal: 12 steps; on right going up and External: 9 steps; on left going up Has following equipment at home: donner, and grabber    OCCUPATION: achieve librarian    PLOF: Independent, likes to hike, body pump classes, very active   PATIENT GOALS to get back to prior level function     OBJECTIVE:             Transitional movements: stiff log rolling supine to sit, stiff with B UE to transition from sit to stand DIAGNOSTIC FINDINGS:  01/15/22 Hip CT IMPRESSION: No acute or healing fractures.   Lumbar CT IMPRESSION: 1. Superior endplate fracture at L1 with over 50% loss of height relative to the T12 vertebral body. 2. Slight retropulsed bone on the right without significant central canal stenosis. 3. No additional fractures. 4. Mild multilevel spondylosis as described.     SCREENING FOR RED FLAGS: Bowel or bladder incontinence: No Spinal tumors: No Cauda equina syndrome: No Compression fracture: Yes: observed in flims Abdominal aneurysm: No   COGNITION:           Overall cognitive status: Within functional limits for tasks assessed                          SENSATION: WFL       POSTURE: in brace    PALPATION: Tenderness to palpation in lumbar/glute muscles   LUMBAR ROM: not tested secondary  to recent fracture- awaiting neuro consult   Active  A/PROM     Flexion    Extension    Right lateral flexion    Left lateral flexion    Right rotation    Left rotation     (Blank rows = not tested)                  LE Measurements not tested on this date       Lower Extremity Right 01/19/2022 Left 01/19/2022    A/PROM MMT A/PROM MMT  Hip Flexion          Hip Extension          Hip Abduction          Hip Adduction          Hip Internal rotation          Hip External rotation          Knee Flexion          Knee Extension          Ankle Dorsiflexion          Ankle Plantarflexion          Ankle Inversion          Ankle Eversion           (Blank rows = not tested)            * pain       TODAY'S TREATMENT  01/25/2022 Therapeutic Exercise:    Aerobic: Supine: hip add iso ball 3 minutes, hip abd band  YTB 3 minutes Prone:    Seated:    Standing: heel raises x20 5" holds, SLS x3 30" holds B Neuromuscular Re-education: diaphragmatic breathing 5 minutes - tactile cues, TRA activation  with long exhale 4 minutes tactile cues Manual Therapy: Therapeutic Activity: Self Care: Trigger Point Dry Needling:  Modalities:      PATIENT EDUCATION:  Education details: on HEP, on different ways to breath Person educated: Patient Education method: Explanation, Demonstration, and Handouts Education comprehension: verbalized understanding       HOME EXERCISE PROGRAM: [id number]   ASSESSMENT:   CLINICAL IMPRESSION: 01/25/2022 Added new exercises to HEP. No pain noted during session. Educated patient on core activation with breathing. Overall improved mobility and reduced pain on this session. Will continue with current POC as tolerated.   Eval: Patient presents with acute lumbar fracture after a fall on a ladder on 01/15/22. She was seen in the ED where they gave her a TSLO brace and scheduled her an apt to follow up with neurosurgeon. Session focused on education secondary to wanting  patient to follow up with MD prior to adding in more advanced exercises. Patient would greatly benefit form skilled PT to improve overall function and return them to quality of life.      OBJECTIVE IMPAIRMENTS decreased activity tolerance, decreased endurance, decreased mobility, decreased ROM, improper body mechanics, postural dysfunction, and pain.    ACTIVITY LIMITATIONS carrying, lifting, bending, sitting, standing, squatting, sleeping, transfers, and bed mobility   PARTICIPATION LIMITATIONS: meal prep, cleaning, driving, community activity, and yard work   [place] are also affecting patient's functional outcome.    REHAB POTENTIAL: Excellent   CLINICAL DECISION MAKING: Stable/uncomplicated   EVALUATION COMPLEXITY: Low       GOALS: Goals reviewed with patient?  yes   SHORT TERM GOALS:   Patient will be independent in self management strategies to improve quality of life and functional outcomes. Baseline: new program Target date: 03/02/2022 Goal status: INITIAL   2.  Patient will report at least 50% improvement in overall symptoms and/or function to demonstrate improved functional mobility Baseline: 0% Target date: 03/02/2022 Goal status: INITIAL   3.  Patient will be able to get in and out of the bed without pain with transition to standing to improve transitional mobility Baseline: painful Target date: 03/02/2022 Goal status: INITIAL   4.  Patient will be able to ascend and descend stairs with reciprocal gait pattern and no railing to improve ability to go up and down stairs at home  Baseline: one at a time with railing Target date: 03/02/2022 Goal status: INITIAL         LONG TERM GOALS:   Patient will report at least 75% improvement in overall symptoms and/or function to demonstrate improved functional mobility Baseline: 0% Target date: 04/13/2022 Goal status: INITIAL   2.  Patient will be able to demonstrate good lifting mechanics out of  brace, pending MD approval to return to PLOF Baseline: unable Target date: 04/13/2022 Goal status: INITIAL   3.  Patient will be able to hike up to 5 miles without pain or difficulties, per MD approval to return to PLOF. Baseline: unable Target date: 04/13/2022 Goal status: INITIAL   4.  Patient will be able to sit for at least 60 minutes at a time without pain to improve sitting tolerance Baseline: unable Target date: 04/13/2022 Goal status: INITIAL           PLAN: PT FREQUENCY: 1-2x/week for total of 20 visits over 12 week certification   PT DURATION: 12 weeks   PLANNED INTERVENTIONS: Therapeutic exercises, Therapeutic activity, Neuromuscular re-education, Balance training, Gait training, Patient/Family education, Self Care, Joint mobilization, Orthotic/Fit training, Aquatic Therapy,  Dry Needling, Spinal manipulation, Spinal mobilization, Cryotherapy, Moist heat, Ionotophoresis '4mg'$ /ml Dexamethasone, and Manual therapy.   PLAN FOR NEXT SESSION: table exercises, f/u with breath work, isometrics     11:04 AM, 01/19/22 Jerene Pitch, DPT Physical Therapy with [place]

## 2022-01-25 NOTE — Progress Notes (Signed)
   Leslie Randolph is a 61 y.o. female who presents today for an office visit.  Assessment/Plan:  Overview: Healing appears normal and pain controlled with no neurological changes.  Problem List Items Addressed This Visit       Active Problems   Closed compression fracture of body of L1 vertebra ([facility]) - Primary    Today I spent 20 minutes going through the up-to-date article for the management of osteoporotic compression fractures because when I discussed the problem with her last week and interpreted the CAT scan it was my impression that there is some bone loss in her lumbar spine that contributed to this fracture.  I also printed out the article for her to take with her and review.  I offered additional pain medication but she said her pain is mild and she has not even taken the codeine again last week.  She is wearing the brace and she has gotten it checked again to make sure that is being worn properly.  The pain is slowly improving but it can be very painful to get up from lying down.  She has seen physical therapy and that was helpful.  She is taking calcium and vitamin D but has not had it checked here but declines to have it checked today.  Calcitonin might be helpful for the pain and bone building but she declined that.  We discussed Fosamax and she wants to hold off on that as well and there is not even a strong recommendation for treatment of acute compression fracture with Fosamax but if the neurosurgeon reviews the films with her at their appointment on Monday and feels that there is osteo parotic changes like I do then I encouraged her to let me start her on a bisphosphonate; she wants to hold off for now and in general wants to avoid medications   CT Lumbar 7/30   IMPRESSION: 1. Superior endplate fracture at L1 with over 50% loss of height relative to the T12 vertebral body. 2. Slight retropulsed bone on the right without significant central canal stenosis. 3. No additional  fractures. 4. Mild multilevel spondylosis as described.   These results were called by telephone at the time of interpretation on 01/15/2022 at 4:25 pm to provider [facility] , who verbally acknowledged these results.  Subjective:  HPI:  Patient returns for 1 week follow-up for compression fracture management. Pain is improving slowly. She saw phys therapy last week and will again today.  Neurosurgery first visit will be Monday.  She denies any neuro changes in legs (weakness, tremors, numbness)      Objective:  Physical Exam: BP 102/70 (BP Location: Right Arm)   Pulse 76   Temp 97.9 F (36.6 C) (Temporal)   Ht '5\' 4"'$  (1.626 m)   Wt 126 lb 3.2 oz (57.2 kg)   SpO2 95%   BMI 21.66 kg/m    Gen: No acute distress, resting comfortably Psych: Normal affect and thought content  Problem specific physical exam findings:  Wearing braces.         Loralee Pacas, MD 01/25/2022 11:14 AM

## 2022-01-31 ENCOUNTER — Encounter: Payer: BC Managed Care – PPO | Admitting: Physical Therapy

## 2022-02-02 ENCOUNTER — Encounter: Payer: BC Managed Care – PPO | Admitting: Physical Therapy

## 2022-02-06 ENCOUNTER — Encounter: Payer: BC Managed Care – PPO | Admitting: Physical Therapy

## 2022-02-08 ENCOUNTER — Ambulatory Visit (INDEPENDENT_AMBULATORY_CARE_PROVIDER_SITE_OTHER): Payer: BC Managed Care – PPO | Admitting: Physical Therapy

## 2022-02-08 ENCOUNTER — Encounter: Payer: Self-pay | Admitting: Physical Therapy

## 2022-02-08 DIAGNOSIS — M25551 Pain in right hip: Secondary | ICD-10-CM

## 2022-02-08 DIAGNOSIS — M25552 Pain in left hip: Secondary | ICD-10-CM

## 2022-02-08 DIAGNOSIS — M5459 Other low back pain: Secondary | ICD-10-CM

## 2022-02-08 DIAGNOSIS — M6281 Muscle weakness (generalized): Secondary | ICD-10-CM

## 2022-02-08 NOTE — Therapy (Signed)
OUTPATIENT PHYSICAL THERAPY TREATMENT NOTE   Patient Name: Leslie Randolph MRN: [id number] DOB:09/20/1960, 61 y.o., female Today's Date: 02/08/2022    END OF SESSION:   PT End of Session - 02/08/22 1604     Visit Number 3    Number of Visits 20    Date for PT Re-Evaluation 04/13/22    Authorization Type BCBS    PT Start Time 1604    PT Stop Time 1642    PT Time Calculation (min) 38 min    Activity Tolerance Patient tolerated treatment well    Behavior During Therapy WFL for tasks assessed/performed             Past Medical History:  Diagnosis Date   Fracture of lumbar vertebra, compression ([place]) 01/18/2022   History reviewed. No pertinent surgical history. Patient Active Problem List   Diagnosis Date Noted   Seborrheic keratoses 01/18/2022   Melanocytic nevi of trunk 01/18/2022   Lentigines 01/18/2022   History of skin cancer 01/18/2022   Closed compression fracture of body of L1 vertebra ([place]) 01/18/2022   HLD (hyperlipidemia) 01/28/2013   Basal cell carcinoma of skin 02/02/2012   PCP: Berniece Pap, MD   REFERRING PROVIDER: Berniece Pap, MD   REFERRING DIAG: S32.010D (ICD-10-CM) - Compression fracture of L1 vertebra with routine healing, subsequent encounter   Rationale for Evaluation and Treatment Rehabilitation   THERAPY DIAG:  Other low back pain   Pain in left hip   Pain in right hip   Muscle weakness (generalized)   ONSET DATE: 01/15/22   SUBJECTIVE:                                                                                                                                                                                            SUBJECTIVE STATEMENT: Saw neuro and they said she  can't lift weights and can walk and go on elliptical. States that she is allowed to take the brace off with sleeping and showering. Will follow up with MD on 03/22/22 and will be in the brace until then. States that when she has pain it is at the end of the day, she  just vacuumed her whole house and is sore. States her pain is worse at the end of the day.  Eval: Patient fell from 6 ft ladder and landed on tailbone on 02/15/22. Had tingling in hips and feet with sitting at first and now mainly pain in hips with R>L. States she was placed in a TLSO brace and sees neurosurgery on 01/30/22.   Pain is worse in the morning, has been wearing brace in bed and  all day. States that she is constantly adjusting the brace as it shifts.   PERTINENT HISTORY:  none   PAIN:  Are you having pain? Yes: NPRS scale: 2/10 Pain location: in low back  Pain description: aching Aggravating factors: sitting Relieving factors: laying down, movement     PRECAUTIONS: Other: spinal   WEIGHT BEARING RESTRICTIONS No   FALLS:  Has patient fallen in last 6 months? Yes. Number of falls 1 off ladder   LIVING ENVIRONMENT: Lives with: lives with their family Lives in: House/apartment Stairs: Yes: Internal: 12 steps; on right going up and External: 9 steps; on left going up Has following equipment at home: donner, and grabber    OCCUPATION: achieve librarian    PLOF: Independent, likes to hike, body pump classes, very active   PATIENT GOALS to get back to prior level function     OBJECTIVE:             Transitional movements: stiff log rolling supine to sit, stiff with B UE to transition from sit to stand DIAGNOSTIC FINDINGS:  01/15/22 Hip CT IMPRESSION: No acute or healing fractures.   Lumbar CT IMPRESSION: 1. Superior endplate fracture at L1 with over 50% loss of height relative to the T12 vertebral body. 2. Slight retropulsed bone on the right without significant central canal stenosis. 3. No additional fractures. 4. Mild multilevel spondylosis as described.     SCREENING FOR RED FLAGS: Bowel or bladder incontinence: No Spinal tumors: No Cauda equina syndrome: No Compression fracture: Yes: observed in flims Abdominal aneurysm: No   COGNITION:            Overall cognitive status: Within functional limits for tasks assessed                          SENSATION: WFL       POSTURE: in brace    PALPATION: Tenderness to palpation in lumbar/glute muscles   LUMBAR ROM: not tested secondary to recent fracture- awaiting neuro consult   Active  A/PROM     Flexion    Extension    Right lateral flexion    Left lateral flexion    Right rotation    Left rotation     (Blank rows = not tested)                  LE Measurements not tested on this date       Lower Extremity Right 01/19/2022 Left 01/19/2022    A/PROM MMT A/PROM MMT  Hip Flexion          Hip Extension          Hip Abduction          Hip Adduction          Hip Internal rotation          Hip External rotation          Knee Flexion          Knee Extension          Ankle Dorsiflexion          Ankle Plantarflexion          Ankle Inversion          Ankle Eversion           (Blank rows = not tested)            * pain       TODAY'S TREATMENT  02/08/2022 Therapeutic Exercise:    Aerobic: Supine:  Prone:    Seated:    Standing:  Neuromuscular Re-education: diaphragmatic breathing 8 minutes - tactile cues, TRA activation with long exhale 4 minutes tactile cues - difficult to perform,hooklying leg extension - one side - for core 6 minutes total, - laughing/coughing for core activation, alternating bent knee fall outs for core activation x30 B, quadruped shoulder taps - very difficult  Manual Therapy: Therapeutic Activity: Self Care: Trigger Point Dry Needling:  Modalities:      PATIENT EDUCATION:  Education details: on HEP,on importance of activation of core Person educated: Patient Education method: Explanation, Demonstration, and Handouts Education comprehension: verbalized understanding       HOME EXERCISE PROGRAM: [id number]   ASSESSMENT:   CLINICAL IMPRESSION: 02/08/2022 Reduced ability to engage core on this date compared to previous session. Session  focused on breath work and using automatic functions to "feel" core. Very difficult for patient to perform but improve activation noted by end of session. Trailed quadruped shoulder taps, good stabilization but mostly felt pressure at L1, stopped. Instructed patient to focus on laughing/engaging core as this has regressed in recent weeks. Will continue with current POC as tolerated.   Eval: Patient presents with acute lumbar fracture after a fall on a ladder on 01/15/22. She was seen in the ED where they gave her a TSLO brace and scheduled her an apt to follow up with neurosurgeon. Session focused on education secondary to wanting patient to follow up with MD prior to adding in more advanced exercises. Patient would greatly benefit form skilled PT to improve overall function and return them to quality of life.      OBJECTIVE IMPAIRMENTS decreased activity tolerance, decreased endurance, decreased mobility, decreased ROM, improper body mechanics, postural dysfunction, and pain.    ACTIVITY LIMITATIONS carrying, lifting, bending, sitting, standing, squatting, sleeping, transfers, and bed mobility   PARTICIPATION LIMITATIONS: meal prep, cleaning, driving, community activity, and yard work   [place] are also affecting patient's functional outcome.    REHAB POTENTIAL: Excellent   CLINICAL DECISION MAKING: Stable/uncomplicated   EVALUATION COMPLEXITY: Low       GOALS: Goals reviewed with patient?  yes   SHORT TERM GOALS:   Patient will be independent in self management strategies to improve quality of life and functional outcomes. Baseline: new program Target date: 03/02/2022 Goal status: INITIAL   2.  Patient will report at least 50% improvement in overall symptoms and/or function to demonstrate improved functional mobility Baseline: 0% Target date: 03/02/2022 Goal status: INITIAL   3.  Patient will be able to get in and out of the bed without pain with transition to  standing to improve transitional mobility Baseline: painful Target date: 03/02/2022 Goal status: INITIAL   4.  Patient will be able to ascend and descend stairs with reciprocal gait pattern and no railing to improve ability to go up and down stairs at home  Baseline: one at a time with railing Target date: 03/02/2022 Goal status: INITIAL         LONG TERM GOALS:   Patient will report at least 75% improvement in overall symptoms and/or function to demonstrate improved functional mobility Baseline: 0% Target date: 04/13/2022 Goal status: INITIAL   2.  Patient will be able to demonstrate good lifting mechanics out of brace, pending MD approval to return to PLOF Baseline: unable Target date: 04/13/2022 Goal status: INITIAL   3.  Patient will be able to hike up to  5 miles without pain or difficulties, per MD approval to return to PLOF. Baseline: unable Target date: 04/13/2022 Goal status: INITIAL   4.  Patient will be able to sit for at least 60 minutes at a time without pain to improve sitting tolerance Baseline: unable Target date: 04/13/2022 Goal status: INITIAL           PLAN: PT FREQUENCY: 1-2x/week for total of 20 visits over 12 week certification   PT DURATION: 12 weeks   PLANNED INTERVENTIONS: Therapeutic exercises, Therapeutic activity, Neuromuscular re-education, Balance training, Gait training, Patient/Family education, Self Care, Joint mobilization, Orthotic/Fit training, Aquatic Therapy, Dry Needling, Spinal manipulation, Spinal mobilization, Cryotherapy, Moist heat, Ionotophoresis '4mg'$ /ml Dexamethasone, and Manual therapy.   PLAN FOR NEXT SESSION: tcore activation,/u with breath work, isometrics- no BLTs     11:04 AM, 01/19/22 Jerene Pitch, DPT Physical Therapy with [facility]

## 2022-02-10 ENCOUNTER — Telehealth: Payer: Self-pay | Admitting: Internal Medicine

## 2022-02-10 NOTE — Telephone Encounter (Signed)
Patient requests to have a Bone Density Scan ordered (Patient states she needs a baseline of her bone density).  Patient requests to have the Bone Density Scan done at [facility].  Patient requests to be notified via Mychart or a phone call once Order for the above has been completed.

## 2022-02-13 ENCOUNTER — Encounter: Payer: BC Managed Care – PPO | Admitting: Physical Therapy

## 2022-02-14 ENCOUNTER — Other Ambulatory Visit: Payer: Self-pay

## 2022-02-14 DIAGNOSIS — Z13820 Encounter for screening for osteoporosis: Secondary | ICD-10-CM

## 2022-02-14 NOTE — Telephone Encounter (Signed)
Ordered the bone density scan for the [facility]. Left a vm informing patient with their office number to schedule an appt if she would like.

## 2022-02-16 ENCOUNTER — Encounter: Payer: BC Managed Care – PPO | Admitting: Physical Therapy

## 2022-02-23 ENCOUNTER — Encounter: Payer: Self-pay | Admitting: Physical Therapy

## 2022-02-23 ENCOUNTER — Ambulatory Visit (INDEPENDENT_AMBULATORY_CARE_PROVIDER_SITE_OTHER): Payer: BC Managed Care – PPO | Admitting: Physical Therapy

## 2022-02-23 DIAGNOSIS — M6281 Muscle weakness (generalized): Secondary | ICD-10-CM | POA: Diagnosis not present

## 2022-02-23 DIAGNOSIS — M25551 Pain in right hip: Secondary | ICD-10-CM

## 2022-02-23 DIAGNOSIS — M5459 Other low back pain: Secondary | ICD-10-CM | POA: Diagnosis not present

## 2022-02-23 DIAGNOSIS — M25552 Pain in left hip: Secondary | ICD-10-CM

## 2022-02-23 NOTE — Therapy (Addendum)
OUTPATIENT PHYSICAL THERAPY TREATMENT NOTE   Patient Name: Leslie Randolph MRN: [id number] DOB:[birth year], 61 y.o., female Today's Date: 02/23/2022    END OF SESSION:   PT End of Session - 02/23/22 1638     Visit Number 4    Number of Visits 20    Date for PT Re-Evaluation 04/13/22    Authorization Type BCBS    PT Start Time 1604    PT Stop Time 1638    PT Time Calculation (min) 34 min    Activity Tolerance Patient tolerated treatment well    Behavior During Therapy [facility] for tasks assessed/performed              Past Medical History:  Diagnosis Date   Fracture of lumbar vertebra, compression ([place]) 01/18/2022   History reviewed. No pertinent surgical history. Patient Active Problem List   Diagnosis Date Noted   Seborrheic keratoses 01/18/2022   Melanocytic nevi of trunk 01/18/2022   Lentigines 01/18/2022   History of skin cancer 01/18/2022   Closed compression fracture of body of L1 vertebra ([place]) 01/18/2022   HLD (hyperlipidemia) 01/28/2013   Basal cell carcinoma of skin 02/02/2012   PCP: Berniece Pap, MD   REFERRING PROVIDER: Berniece Pap, MD   REFERRING DIAG: S32.010D (ICD-10-CM) - Compression fracture of L1 vertebra with routine healing, subsequent encounter   Rationale for Evaluation and Treatment Rehabilitation   THERAPY DIAG:  Other low back pain   Pain in left hip   Pain in right hip   Muscle weakness (generalized)   ONSET DATE: 01/15/22   SUBJECTIVE:                                                                                                                                                                                            SUBJECTIVE STATEMENT:  PT states doing very well. Still wearing brace. Has f/u with MD at beginning of October. Has been Walking a few miles, very minimal soreness.    Eval: Patient fell from 6 ft ladder and landed on tailbone on 02/15/22. Had tingling in hips and feet with sitting at first and now mainly pain  in hips with R>L. States she was placed in a TLSO brace and sees neurosurgery on 01/30/22.    PERTINENT HISTORY:  none   PAIN:  Are you having pain? Yes: NPRS scale: 2/10 Pain location: in low back  Pain description: aching Aggravating factors: sitting Relieving factors: laying down, movement     PRECAUTIONS: Other: spinal   WEIGHT BEARING RESTRICTIONS No   FALLS:  Has patient fallen in last 6 months? Yes. Number of falls 1 off  ladder   LIVING ENVIRONMENT: Lives with: lives with their family Lives in: House/apartment Stairs: Yes: Internal: 12 steps; on right going up and External: 9 steps; on left going up Has following equipment at home: donner, and grabber    OCCUPATION: achieve librarian    PLOF: Independent, likes to hike, body pump classes, very active   PATIENT GOALS to get back to prior level function     OBJECTIVE:             Transitional movements: stiff log rolling supine to sit, stiff with B UE to transition from sit to stand DIAGNOSTIC FINDINGS:  01/15/22 Hip CT IMPRESSION: No acute or healing fractures.   Lumbar CT IMPRESSION: 1. Superior endplate fracture at L1 with over 50% loss of height relative to the T12 vertebral body. 2. Slight retropulsed bone on the right without significant central canal stenosis. 3. No additional fractures. 4. Mild multilevel spondylosis as described.     SCREENING FOR RED FLAGS: Bowel or bladder incontinence: No Spinal tumors: No Cauda equina syndrome: No Compression fracture: Yes: observed in flims Abdominal aneurysm: No   COGNITION:           Overall cognitive status: Within functional limits for tasks assessed                          SENSATION: WFL      POSTURE: in brace    PALPATION: Tenderness to palpation in lumbar/glute muscles   LUMBAR ROM: not tested secondary to recent fracture- awaiting neuro consult   Active  A/PROM     Flexion    Extension    Right lateral flexion    Left lateral  flexion    Right rotation    Left rotation     (Blank rows = not tested)                  LE Measurements not tested on this date       Lower Extremity Right 01/19/2022 Left 01/19/2022    A/PROM MMT A/PROM MMT  Hip Flexion          Hip Extension          Hip Abduction          Hip Adduction          Hip Internal rotation          Hip External rotation          Knee Flexion          Knee Extension          Ankle Dorsiflexion          Ankle Plantarflexion          Ankle Inversion          Ankle Eversion           (Blank rows = not tested)            * pain       TODAY'S TREATMENT   02/23/2022 Therapeutic Exercise:    Aerobic: Supine: TA x 10;  TA with march x 20 with GTB;  TA with Clams x 20 GTB;  Prone: S/L hip abd x 15 bil;     Seated:     Standing: Slow march x 20;  Mini squats x 20; Sit to stand from high mat table x 10; Hip abd 2x10 bil with TA;   Neuromuscular Re-education:  Manual Therapy:  Previous: Neuromuscular Re-education: diaphragmatic breathing 8 minutes - tactile cues, TRA activation with long exhale 4 minutes tactile cues - difficult to perform,hooklying leg extension - one side - for core 6 minutes total, - laughing/coughing for core activation, alternating bent knee fall outs for core activation x30 B, quadruped shoulder taps - very difficult     PATIENT EDUCATION:  Education details: on HEP,on importance of activation of core Person educated: Patient Education method: Explanation, Demonstration, and Handouts Education comprehension: verbalized understanding       HOME EXERCISE PROGRAM: [id number]   ASSESSMENT:   CLINICAL IMPRESSION: 02/23/2022 Education on functional motions and exercise permitted at this time. Pt doing well with core activation. She is having minimal pain and has been able to increase activity level at home. Discussed return to PT after she has f/u with MD, to lift restrictions and to see degree of healing. Pt in agreement with  plan.   Eval: Patient presents with acute lumbar fracture after a fall on a ladder on 01/15/22. She was seen in the ED where they gave her a TSLO brace and scheduled her an apt to follow up with neurosurgeon. Session focused on education secondary to wanting patient to follow up with MD prior to adding in more advanced exercises. Patient would greatly benefit form skilled PT to improve overall function and return them to quality of life.      OBJECTIVE IMPAIRMENTS decreased activity tolerance, decreased endurance, decreased mobility, decreased ROM, improper body mechanics, postural dysfunction, and pain.    ACTIVITY LIMITATIONS carrying, lifting, bending, sitting, standing, squatting, sleeping, transfers, and bed mobility   PARTICIPATION LIMITATIONS: meal prep, cleaning, driving, community activity, and yard work   [place] are also affecting patient's functional outcome.    REHAB POTENTIAL: Excellent   CLINICAL DECISION MAKING: Stable/uncomplicated   EVALUATION COMPLEXITY: Low       GOALS: Goals reviewed with patient?  yes   SHORT TERM GOALS:   Patient will be independent in self management strategies to improve quality of life and functional outcomes. Baseline: new program Target date: 03/02/2022 Goal status: INITIAL   2.  Patient will report at least 50% improvement in overall symptoms and/or function to demonstrate improved functional mobility Baseline: 0% Target date: 03/02/2022 Goal status: INITIAL   3.  Patient will be able to get in and out of the bed without pain with transition to standing to improve transitional mobility Baseline: painful Target date: 03/02/2022 Goal status: INITIAL   4.  Patient will be able to ascend and descend stairs with reciprocal gait pattern and no railing to improve ability to go up and down stairs at home  Baseline: one at a time with railing Target date: 03/02/2022 Goal status: INITIAL         LONG TERM GOALS:    Patient will report at least 75% improvement in overall symptoms and/or function to demonstrate improved functional mobility Baseline: 0% Target date: 04/13/2022 Goal status: INITIAL   2.  Patient will be able to demonstrate good lifting mechanics out of brace, pending MD approval to return to PLOF Baseline: unable Target date: 04/13/2022 Goal status: INITIAL   3.  Patient will be able to hike up to 5 miles without pain or difficulties, per MD approval to return to PLOF. Baseline: unable Target date: 04/13/2022 Goal status: INITIAL   4.  Patient will be able to sit for at least 60 minutes at a time without pain to improve sitting tolerance Baseline: unable Target date:  04/13/2022 Goal status: INITIAL           PLAN: PT FREQUENCY: 1-2x/week for total of 20 visits over 12 week certification   PT DURATION: 12 weeks   PLANNED INTERVENTIONS: Therapeutic exercises, Therapeutic activity, Neuromuscular re-education, Balance training, Gait training, Patient/Family education, Self Care, Joint mobilization, Orthotic/Fit training, Aquatic Therapy, Dry Needling, Spinal manipulation, Spinal mobilization, Cryotherapy, Moist heat, Ionotophoresis '4mg'$ /ml Dexamethasone, and Manual therapy.   PLAN FOR NEXT SESSION: tcore activation,/u with breath work, isometrics- no BLTs     Lyndee Hensen, PT, DPT 4:41 PM  02/23/22   PHYSICAL THERAPY DISCHARGE SUMMARY  Visits from Start of Care: 4 Plan: Patient agrees to discharge.  Patient goals were met. Patient is being discharged due to meeting the stated rehab goals.    Pt did not return after last PT visit.   Lyndee Hensen, PT, DPT 10:11 AM  07/13/22

## 2022-02-27 ENCOUNTER — Encounter: Payer: BC Managed Care – PPO | Admitting: Physical Therapy

## 2022-03-02 ENCOUNTER — Encounter: Payer: BC Managed Care – PPO | Admitting: Physical Therapy

## 2022-03-03 ENCOUNTER — Other Ambulatory Visit: Payer: Self-pay | Admitting: Internal Medicine

## 2022-03-03 DIAGNOSIS — Z1231 Encounter for screening mammogram for malignant neoplasm of breast: Secondary | ICD-10-CM

## 2022-03-06 ENCOUNTER — Telehealth: Payer: Self-pay | Admitting: Internal Medicine

## 2022-03-06 ENCOUNTER — Encounter: Payer: BC Managed Care – PPO | Admitting: Physical Therapy

## 2022-03-06 NOTE — Telephone Encounter (Signed)
Pt states: -[facility] requires Rx for covid-19 booster -pt leaves town 09/23 and would like to go before then.  Pt requests -Rx to be sent to: [facility] ID: [id number]  [place], [place] [address]

## 2022-03-07 ENCOUNTER — Other Ambulatory Visit: Payer: Self-pay

## 2022-03-09 ENCOUNTER — Other Ambulatory Visit: Payer: Self-pay

## 2022-03-09 ENCOUNTER — Encounter: Payer: BC Managed Care – PPO | Admitting: Physical Therapy

## 2022-03-09 DIAGNOSIS — Z23 Encounter for immunization: Secondary | ICD-10-CM

## 2022-03-09 NOTE — Telephone Encounter (Signed)
Spoke with patient and informed her that this is not necessary. After speaking to [facility] [address] (whom informed me of this).

## 2022-03-13 ENCOUNTER — Encounter: Payer: BC Managed Care – PPO | Admitting: Physical Therapy

## 2022-03-16 ENCOUNTER — Encounter: Payer: BC Managed Care – PPO | Admitting: Physical Therapy

## 2022-03-30 ENCOUNTER — Ambulatory Visit
Admission: RE | Admit: 2022-03-30 | Discharge: 2022-03-30 | Disposition: A | Discharge status: Home / Self Care | Payer: BC Managed Care – PPO | Source: Ambulatory Visit | Attending: Internal Medicine | Admitting: Internal Medicine

## 2022-03-30 DIAGNOSIS — Z1231 Encounter for screening mammogram for malignant neoplasm of breast: Secondary | ICD-10-CM

## 2022-03-30 DIAGNOSIS — Z13820 Encounter for screening for osteoporosis: Secondary | ICD-10-CM

## 2022-03-31 NOTE — Progress Notes (Signed)
I have reviewed your recent results and, in my medical opinion:  The problem is mainly in the lumbar spine where the bone density is very, very, very thin.   Despite calcium and vitamin D.  Note the T-score for the lumbar spine and please read up about how to interpret that score online:  LUMBAR SPINE (L3-L4): BMD (in g/cm2): 0.699 T-score: -3.7 Z-score: -2.2  If it was my spine, even if I wanted to avoid medications, I would go on the strongest bone building medicine available (which is Forteo).    Its expensive but insurance would cover due to the high t-score and history of spine fracture.  I encourage you to read about it and let me know if you are interested I'll send it in.  Other options if you aren't sure are seeing an endocrine bone health clinic to discuss (I can refer), or just coming in and talking with me more at an appointment.   Loralee Pacas, MD  03/31/2022 9:05 AM

## 2022-04-02 ENCOUNTER — Encounter: Payer: Self-pay | Admitting: Internal Medicine

## 2022-04-02 DIAGNOSIS — M8080XD Other osteoporosis with current pathological fracture, unspecified site, subsequent encounter for fracture with routine healing: Secondary | ICD-10-CM

## 2022-04-04 DIAGNOSIS — M810 Age-related osteoporosis without current pathological fracture: Secondary | ICD-10-CM | POA: Insufficient documentation

## 2022-04-18 ENCOUNTER — Other Ambulatory Visit: Payer: Self-pay

## 2022-04-19 NOTE — Telephone Encounter (Signed)
Patient returned call stating that Patient has never requested syringes and does not know why she received message.

## 2022-04-20 NOTE — Telephone Encounter (Signed)
Confirmation that this message was received.

## 2023-02-22 ENCOUNTER — Other Ambulatory Visit: Payer: Self-pay | Admitting: Internal Medicine

## 2023-02-22 DIAGNOSIS — Z1231 Encounter for screening mammogram for malignant neoplasm of breast: Secondary | ICD-10-CM

## 2023-03-23 ENCOUNTER — Telehealth: Payer: Self-pay | Admitting: Internal Medicine

## 2023-03-23 NOTE — Telephone Encounter (Signed)
FYI: This call has been transferred to triage nurse: the Triage Nurse. Once the result note has been entered staff can address the message at that time.  Patient called in with the following symptoms:  Red Word: Possible allergic reaction (avocado). Hives all over, rapidly spreading , very itchy   Please advise at Mobile [phone] (mobile)  Message is routed to Provider Pool.

## 2023-03-23 NOTE — Telephone Encounter (Signed)
Patient Advised: See PCP within 24 hours.  Nurse Debbie called on back line. Advised there were no appointments available, so Patient would need to go to Urgent Care.  Patient Name First: Leslie Last: Randolph Kinds Gender: Female DOB: [birth year] Age: 62 Y 3 M 15 D Return Phone Number: [phone] (Primary) Address: City/ State/ Zip: [place]  [address] Client [facility] - [facility], [facility] Provider Glenetta Hew- MD Contact Type Call Who Is Calling Patient / Member / Family / Caregiver Call Type Triage / Clinical Relationship To Patient Self Return Phone Number [phone] (Primary) Chief Complaint Hives Reason for Call Symptomatic / Request for Health Information Initial Comment Caller has a patient on the line, 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 She sees Dr.Morrison. Patient states they had them once a long time ago, and they are breaking out in spreading hives. GOTO Facility Not Listed [facility] that is on [address] in [place], [place] Translation No Nurse Assessment Nurse: Clarita Leber, RN, Gavin Pound Date/Time (Eastern Time): 03/23/2023 2:18:51 PM Confirm and document reason for call. If symptomatic, describe symptoms. ---The caller states that she has broken out in hives. 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 Had reaction as a teenager. But has gone 45 years without a reaction. Denies no lip swelling or face swelling and no trouble breathing. Does the patient have any new or worsening symptoms? ---Yes Will a triage be completed? ---Yes Related visit to physician within the last 2 weeks? ---No Does the PT have any chronic conditions? (i.e. diabetes, asthma, this includes High risk factors for pregnancy, etc.) ---No Is this a behavioral health or substance abuse call? ---No Guidelines Guideline Title Affirmed Question  Affirmed Notes Nurse Date/Time Lamount Cohen Time) Hives [1] MODERATESEVERE hives (e.g.,hives interfere Clarita Leber, RN, Gavin Pound 03/23/2023 2:20:54 PM  Guidelines Guideline Title Affirmed Question Affirmed Notes Nurse Date/Time (Eastern Time) with normal activities or work) AND [2] not improved after taking antihistamine (e.g., cetirizine, fexofenadine, or loratadine) > 24 hours Disp. Time Lamount Cohen Time) Disposition Final User 03/23/2023 2:11:06 PM Attempt made - message left Clarita Leber, RN, Gavin Pound 03/23/2023 2:29:18 PM See PCP within 24 Hours Yes Clarita Leber, RN, Gavin Pound Final Disposition 03/23/2023 2:29:18 PM See PCP within 24 Hours Yes Clarita Leber, RN, Jetty Duhamel Disagree/Comply Comply Caller Understands Yes PreDisposition Call Doctor Care Advice Given Per Guideline SEE PCP WITHIN 24 HOURS: * Take a bath or shower if hives were triggered by pollens or animal contact.  Comments User: Alita Chyle, RN Date/Time Lamount Cohen Time): 03/23/2023 2:32:16 PM This nurse called the office and they stated that there is no availability and that the caller will have to go to UC/ ED. Caller was informed and this nurse looked for UC in her area and found one that she was aware of the area and she was going there. She did not seem to understand why the office couldn't just give her steroids. So was not experiencing any swelling of the lips, tongue or face and no difficulty breathing or unusual breath sounds. She was agreeable but not happy about going to UC. Referrals GO TO FACILITY OTHER - SPECIFY

## 2023-04-05 ENCOUNTER — Ambulatory Visit
Admission: RE | Admit: 2023-04-05 | Discharge: 2023-04-05 | Disposition: A | Discharge status: Home / Self Care | Payer: BC Managed Care – PPO | Source: Ambulatory Visit | Attending: Internal Medicine | Admitting: Internal Medicine

## 2023-04-05 DIAGNOSIS — Z1231 Encounter for screening mammogram for malignant neoplasm of breast: Secondary | ICD-10-CM

## 2023-08-21 IMAGING — MG MM DIGITAL SCREENING BILAT W/ TOMO AND CAD
8 series · 8 of 24 positions shown · non-contrast
Comparison: Previous exam(s).

CLINICAL DATA: Screening.

EXAM:
DIGITAL SCREENING BILATERAL MAMMOGRAM WITH TOMOSYNTHESIS AND CAD
TECHNIQUE: Bilateral screening digital craniocaudal and mediolateral oblique
mammograms were obtained. Bilateral screening digital breast
tomosynthesis was performed. The images were evaluated with
computer-aided detection.

[R MLO synth-2D]
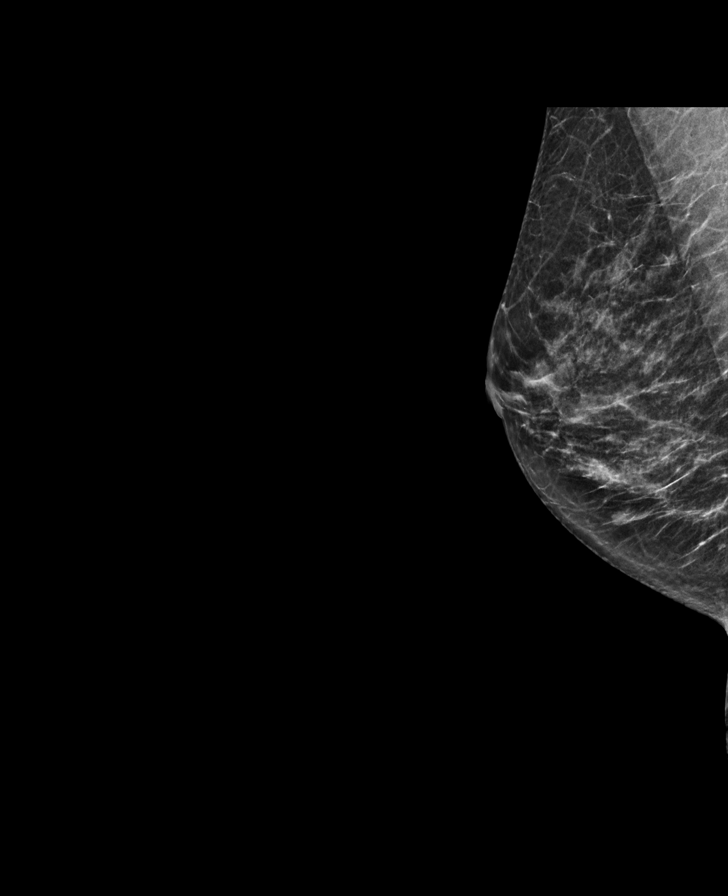

[L MLO synth-2D]
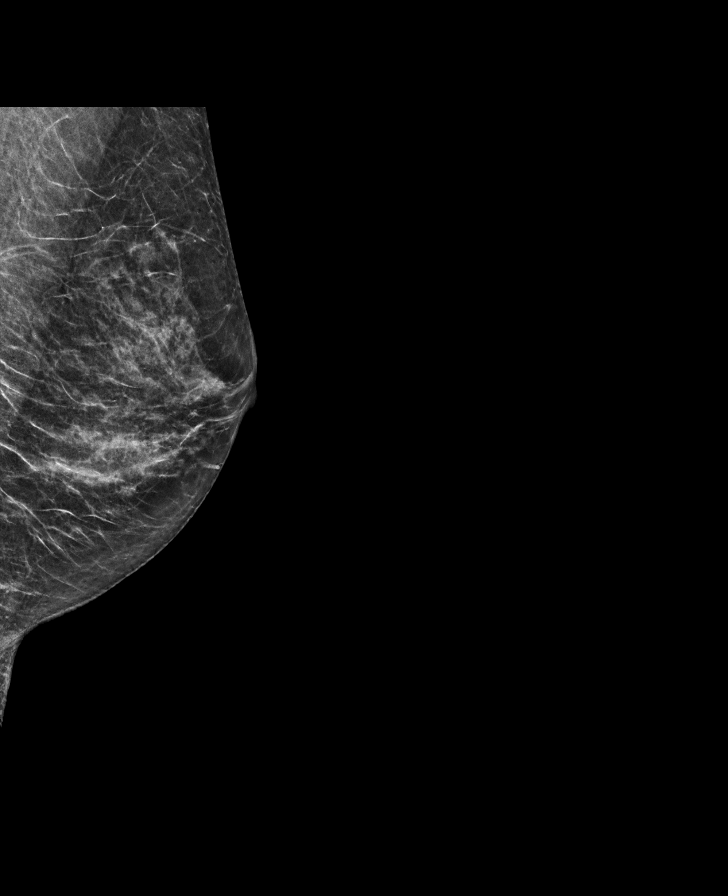

[L CC synth-2D]
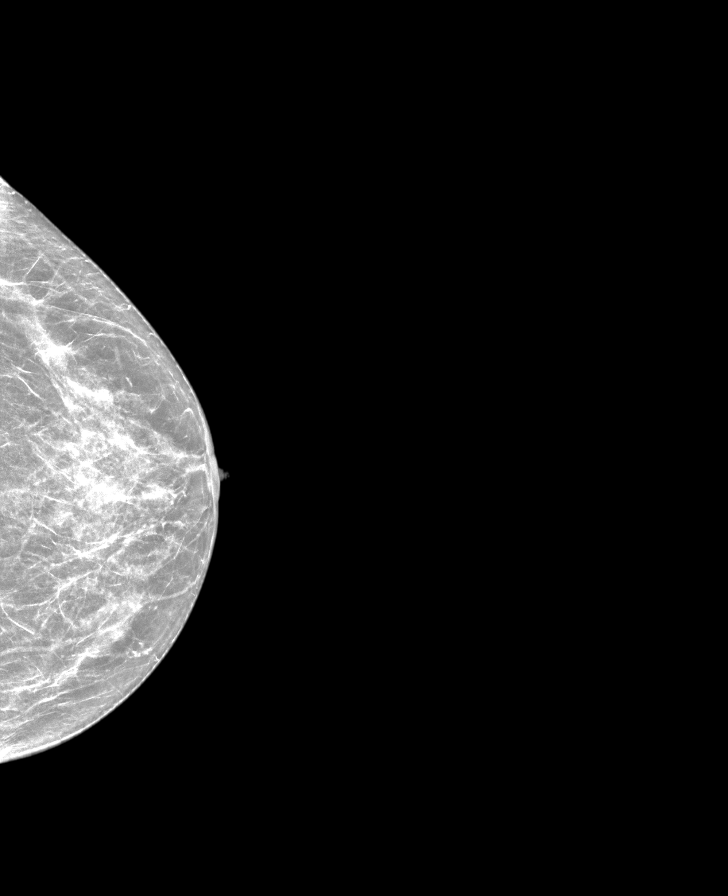

[R CC synth-2D]
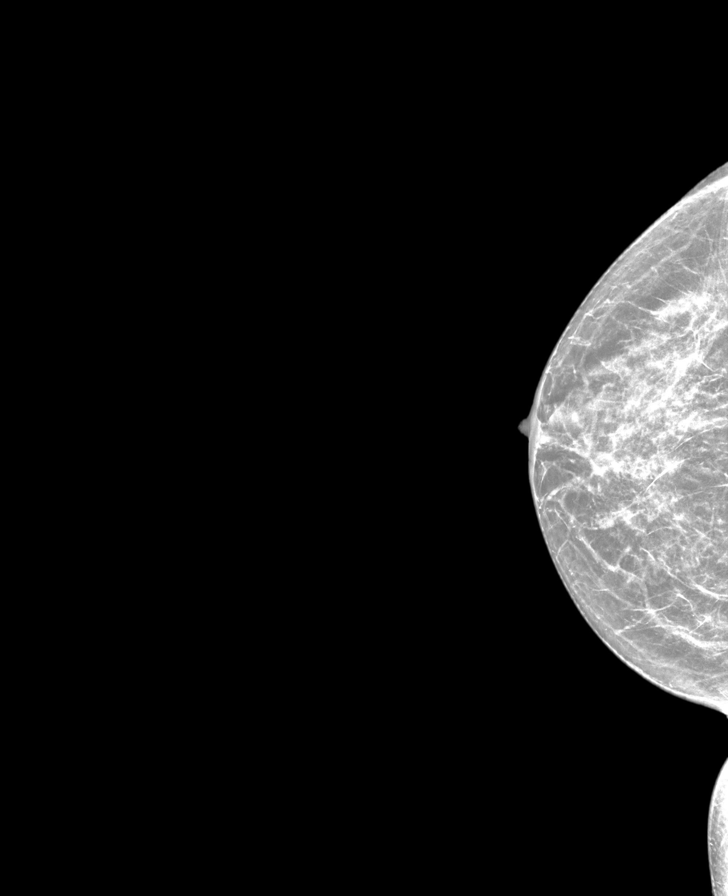

[R CC tomo · tomo slice 27/54.0]
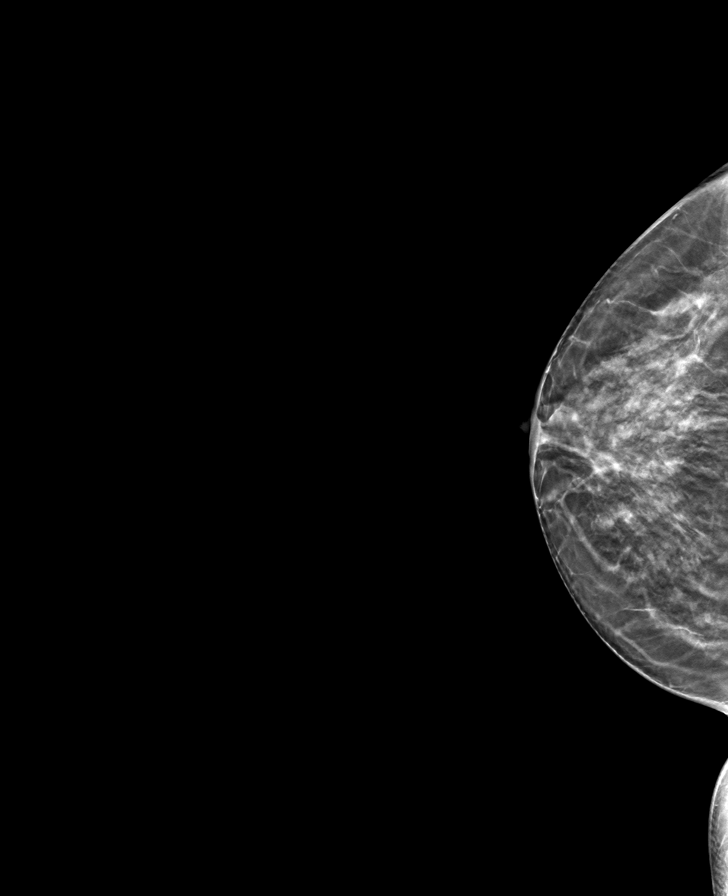

[L CC tomo · tomo slice 27/54.0]
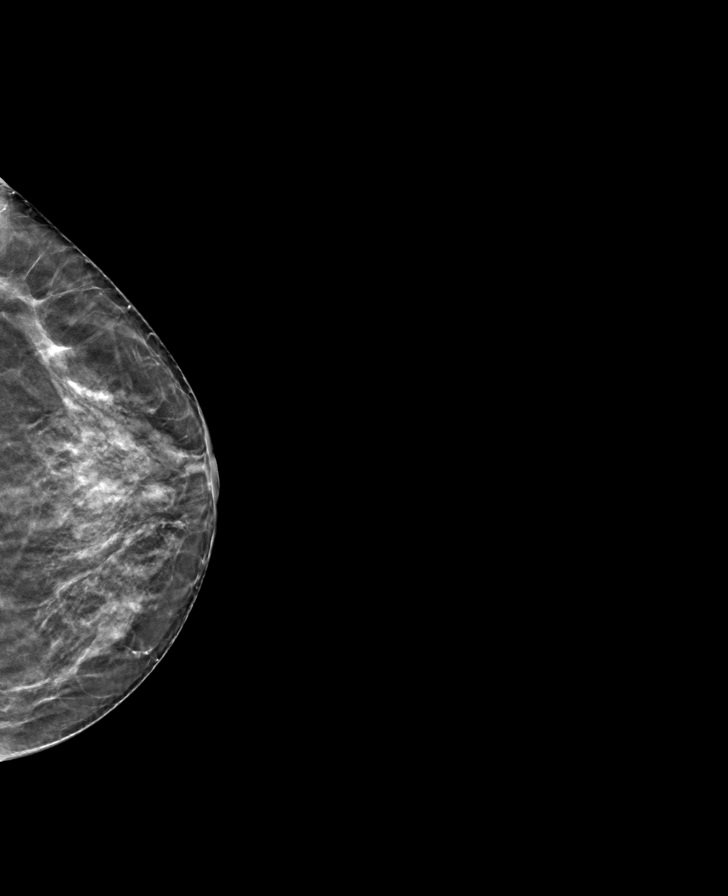

[L MLO tomo · tomo slice 26/51.0]
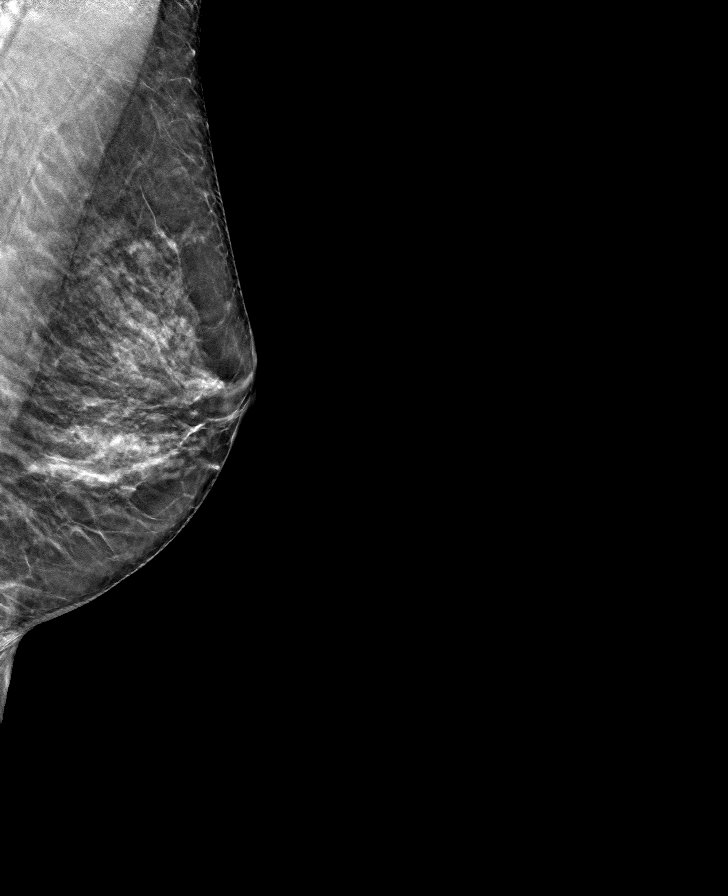

[R MLO tomo · tomo slice 27/53.0]
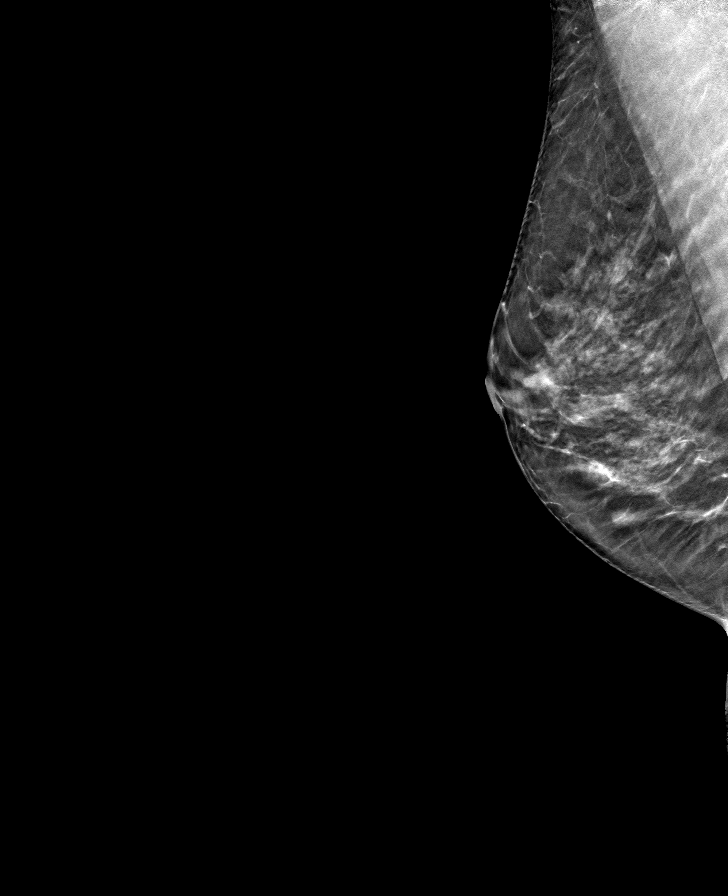

[8 of 24 positions shown; findings below may reference images not displayed]

ACR Breast Density Category c: The breast tissue is heterogeneously
dense, which may obscure small masses.
FINDINGS: There are no findings suspicious for malignancy.
IMPRESSION: No mammographic evidence of malignancy. A result letter of this
screening mammogram will be mailed directly to the patient.

RECOMMENDATION:
Screening mammogram in one year. (Code:Q3-W-BC3)

BI-RADS CATEGORY  1: Negative.

## 2024-03-31 ENCOUNTER — Other Ambulatory Visit: Payer: Self-pay | Admitting: Internal Medicine

## 2024-03-31 DIAGNOSIS — Z1231 Encounter for screening mammogram for malignant neoplasm of breast: Secondary | ICD-10-CM

## 2024-04-16 ENCOUNTER — Ambulatory Visit
Admission: RE | Admit: 2024-04-16 | Discharge: 2024-04-16 | Disposition: A | Discharge status: Home / Self Care | Source: Ambulatory Visit | Attending: Internal Medicine | Admitting: Internal Medicine

## 2024-04-16 DIAGNOSIS — Z1231 Encounter for screening mammogram for malignant neoplasm of breast: Secondary | ICD-10-CM

## 2024-04-20 ENCOUNTER — Ambulatory Visit: Payer: Self-pay | Admitting: Internal Medicine
